# Patient Record
Sex: Female | Born: 1995 | Race: White | Hispanic: No | Marital: Single | Smoking: Never smoker
Health system: Southern US, Community
[De-identification: ages and names within clinical notes are randomized; demographics above are authoritative.]

---
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
## 2017-08-06 ENCOUNTER — Other Ambulatory Visit: Payer: Self-pay

## 2017-08-06 ENCOUNTER — Encounter: Payer: Self-pay | Admitting: Emergency Medicine

## 2017-08-06 ENCOUNTER — Emergency Department (INDEPENDENT_AMBULATORY_CARE_PROVIDER_SITE_OTHER)
Admission: EM | Admit: 2017-08-06 | Discharge: 2017-08-06 | Disposition: A | Discharge status: Home / Self Care | Payer: BLUE CROSS/BLUE SHIELD | Source: Home / Self Care | Attending: Emergency Medicine | Admitting: Emergency Medicine

## 2017-08-06 DIAGNOSIS — R0981 Nasal congestion: Secondary | ICD-10-CM | POA: Diagnosis not present

## 2017-08-06 DIAGNOSIS — R040 Epistaxis: Secondary | ICD-10-CM

## 2017-08-06 DIAGNOSIS — R112 Nausea with vomiting, unspecified: Secondary | ICD-10-CM

## 2017-08-06 LAB — POCT INFLUENZA A/B
Influenza A, POC: NEGATIVE
Influenza B, POC: NEGATIVE

## 2017-08-06 LAB — POCT RAPID STREP A (OFFICE): Rapid Strep A Screen: NEGATIVE

## 2017-08-06 MED ORDER — ONDANSETRON 8 MG PO TBDP: 8.0000 mg | ORAL_TABLET | Freq: Three times a day (TID) | ORAL | 0 refills | Status: DC | PRN

## 2017-08-06 NOTE — Discharge Instructions (Addendum)
Please drink good quantities of fluids. Apply Vaseline to the inside of your left nares. Take Zyrtec 1 a day. Take Zofran every 6 hours as needed for nausea.

## 2017-08-06 NOTE — ED Triage Notes (Signed)
Patient reports vomiting, congestion, headaches since yesterday; no known fever; states has had epistaxis and some chest discomfort.

## 2017-08-06 NOTE — ED Provider Notes (Addendum)
[facility]    CSN: [id number] Arrival date & time: 08/06/17  1350     History   Chief Complaint Chief Complaint  Patient presents with  . Emesis  . Headache  . Nasal Congestion    HPI Paula Buchanan is a 22 y.o. female.   HPI Patient was in her usual state of health until yesterday.  She developed nasal congestion.  Last night she had hot flashes and sweats associated with myalgias.  She did not have a flu shot last year she has had myalgias associated with this.  This morning she had a headache associated with sinus congestion and noticed bright red blood from her left nares.  She has had 2 episodes of nausea with vomiting.  There were some flecks of bright red blood in her vomitus.  She has had no abdominal pain and no heartburn.  She has a minimal cough.  She works and goes to school.  Her only medications being her birth control pills. History reviewed. No pertinent past medical history.  Patient Active Problem List   Diagnosis Date Noted  . Strain of right triceps 02/15/2015  . 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 No pertinent surgical history.  OB History   None      Home Medications    Prior to Admission medications   Medication Sig Start Date End Date Taking? Authorizing Provider  ondansetron (ZOFRAN-ODT) 8 MG disintegrating tablet Take 1 tablet (8 mg total) by mouth every 8 (eight) hours as needed for nausea. 08/06/17   Collene Gobbleaub, Dunia Pringle A, MD    Family History Family History  Problem Relation Age of Onset  . Cancer Other        colon CA    Social History Social History   Tobacco Use  . Smoking status: Never Smoker  . Smokeless tobacco: Never Used  Substance Use Topics  . Alcohol use: No  . Drug use: No     Allergies   Patient has no known allergies.   Review of Systems Review of Systems  Constitutional: Positive for chills, fatigue and fever.  HENT:  Positive for congestion, nosebleeds and postnasal drip. Negative for sore throat.   Respiratory: Negative.  Negative for cough and shortness of breath.   Cardiovascular: Positive for chest pain.  Gastrointestinal: Positive for nausea and vomiting.     Physical Exam Triage Vital Signs ED Triage Vitals  Enc Vitals Group     BP 08/06/17 1412 112/72     Pulse Rate 08/06/17 1412 84     Resp 08/06/17 1412 16     Temp 08/06/17 1412 98.6 F (37 C)     Temp Source 08/06/17 1412 Oral     SpO2 08/06/17 1412 97 %     Weight 08/06/17 1413 138 lb (62.6 kg)     Height 08/06/17 1413 5\' 4"  (1.626 m)     Head Circumference --      Peak Flow --      Pain Score 08/06/17 1413 0     Pain Loc --      Pain Edu? --      Excl. in GC? --    No data found.  Updated Vital Signs BP 112/72 (BP Location: Left Arm)   Pulse 84   Temp 98.6 F (37 C) (Oral)   Resp 16   Ht 5\' 4"  (1.626 m)   Wt 138 lb (62.6 kg)  LMP 07/18/2017 (Exact Date)   SpO2 97%   BMI 23.69 kg/m   Visual Acuity Right Eye Distance:   Left Eye Distance:   Bilateral Distance:    Right Eye Near:   Left Eye Near:    Bilateral Near:     Physical Exam  Constitutional: She appears well-developed and well-nourished.  Non-toxic appearance. No distress.  HENT:  Head: Normocephalic and atraumatic.  Mouth/Throat: Oropharynx is clear and moist.  Eyes: Pupils are equal, round, and reactive to light. EOM are normal.  Neck: Normal range of motion. Neck supple.  Cardiovascular: Normal rate, regular rhythm and normal heart sounds.  Pulmonary/Chest: Effort normal and breath sounds normal.  Abdominal: Soft. She exhibits no distension and no mass. There is no tenderness. There is no guarding.  Neurological: She is alert.  Skin: Skin is warm and dry.  Psychiatric: She has a normal mood and affect. Her behavior is normal.     UC Treatments / Results  Labs (all labs ordered are listed, but only abnormal results are displayed) Labs  Reviewed  POCT RAPID STREP A (OFFICE)  POCT INFLUENZA A/B    EKG None Radiology No results found.  Procedures Procedures (including critical care time)  Medications Ordered in UC Medications - No data to display   Initial Impression / Assessment and Plan / UC Course  I have reviewed the triage vital signs and the nursing notes.  Pertinent labs & imaging results that were available during my care of the patient were reviewed by me and considered in my medical decision making (see chart for details). Patient presents with a viral type illness.  She has had some nausea with this.  She had a left-sided nosebleed and I suspect the vomitus had some blood from the nosebleed.  She did not have a flu shot last year.  She was given Zofran on arrival to help with nausea.  Flu test and strep tests are negative.  She will take Aleve for her headache.  I advised her to take Claritin or Zyrtec.  She will apply Vaseline to the inside of her nose.  She was given a note for work and school.  Prescription sent to the pharmacy for Zofran for nausea.   Please drink good quantities of fluids. Apply Vaseline to the inside of your left nares. Take Zyrtec 1 a day. Take Zofran every 6 hours as needed for nausea.   Final Clinical Impressions(s) / UC Diagnoses   Final diagnoses:  Nasal congestion  Epistaxis  Non-intractable vomiting with nausea, unspecified vomiting type    ED Discharge Orders        Ordered    ondansetron (ZOFRAN-ODT) 8 MG disintegrating tablet  Every 8 hours PRN     08/06/17 1455       Controlled Substance Prescriptions [place] Controlled Substance Registry consulted? Not Applicable   Collene Gobble, MD 08/06/17 1506    Collene Gobble, MD 08/06/17 [id number]

## 2018-02-12 ENCOUNTER — Emergency Department (INDEPENDENT_AMBULATORY_CARE_PROVIDER_SITE_OTHER)
Admission: EM | Admit: 2018-02-12 | Discharge: 2018-02-12 | Disposition: A | Discharge status: Home / Self Care | Payer: BLUE CROSS/BLUE SHIELD | Source: Home / Self Care | Attending: Family Medicine | Admitting: Family Medicine

## 2018-02-12 ENCOUNTER — Other Ambulatory Visit: Payer: Self-pay

## 2018-02-12 ENCOUNTER — Encounter: Payer: Self-pay | Admitting: *Deleted

## 2018-02-12 DIAGNOSIS — H00014 Hordeolum externum left upper eyelid: Secondary | ICD-10-CM

## 2018-02-12 DIAGNOSIS — N920 Excessive and frequent menstruation with regular cycle: Secondary | ICD-10-CM | POA: Insufficient documentation

## 2018-02-12 MED ORDER — CLINDAMYCIN HCL 300 MG PO CAPS: 300.0000 mg | ORAL_CAPSULE | Freq: Three times a day (TID) | ORAL | 0 refills | Status: DC

## 2018-02-12 MED ORDER — POLYMYXIN B-TRIMETHOPRIM 10000-0.1 UNIT/ML-% OP SOLN: 1.0000 [drp] | OPHTHALMIC | 0 refills | Status: AC

## 2018-02-12 NOTE — ED Triage Notes (Signed)
Pt c/o LT eye lid swelling and pain x 5 days after plucking her eye brows. She has applied ABT oint without relief.

## 2018-02-12 NOTE — Discharge Instructions (Signed)
°  Please take antibiotics as prescribed and be sure to complete entire course even if you start to feel better to ensure infection does not come back.  Please follow up with family medicine or an eye specialist in 3-4 days if not improving.

## 2018-02-12 NOTE — ED Provider Notes (Signed)
[facility]    CSN: [id number] Arrival date & time: 02/12/18  1307     History   Chief Complaint Chief Complaint  Patient presents with  . Eye Problem    HPI Paula Buchanan is a 22 y.o. female.   HPI  Paula Buchanan is a 21 y.o. female presenting to UC with c/o Left upper eyelid pain, swelling, redness, and intermittent drainage for 5 days after plucking her eyebrows.  She has applied an antibiotic ointment her mother had the last 5 days w/o relief. She does not know the name of the medication.  She tried to "pop" the area but no relief.  Denies change in vision.    History reviewed. No pertinent past medical history.  Patient Active Problem List   Diagnosis Date Noted  . Menorrhagia 02/12/2018  . Strain of right triceps 02/15/2015  . 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 No pertinent surgical history.  OB History   None      Home Medications    Prior to Admission medications   Medication Sig Start Date End Date Taking? Authorizing Provider  drospirenone-ethinyl estradiol (NIKKI) 3-0.02 MG tablet Take 1 tablet by mouth daily.   Yes [provider]  clindamycin (CLEOCIN) 300 MG capsule Take 1 capsule (300 mg total) by mouth 3 (three) times daily. 02/12/18   Lurene Shadow, PA-C  trimethoprim-polymyxin b (POLYTRIM) ophthalmic solution Place 1 drop into the left eye every 4 (four) hours for 7 days. 02/12/18 02/19/18  Lurene Shadow, PA-C    Family History Family History  Problem Relation Age of Onset  . Cancer Other        colon CA    Social History Social History   Tobacco Use  . Smoking status: Never Smoker  . Smokeless tobacco: Never Used  Substance Use Topics  . Alcohol use: No  . Drug use: No     Allergies   Patient has no known allergies.   Review of Systems Review of Systems  Constitutional: Negative for chills and fever.  Eyes: Negative  for photophobia.  Neurological: Negative for headaches.     Physical Exam Triage Vital Signs ED Triage Vitals  Enc Vitals Group     BP 02/12/18 1324 123/77     Pulse Rate 02/12/18 1324 (!) 115     Resp 02/12/18 1324 16     Temp 02/12/18 1324 98.5 F (36.9 C)     Temp Source 02/12/18 1324 Oral     SpO2 02/12/18 1324 100 %     Weight 02/12/18 1325 142 lb (64.4 kg)     Height 02/12/18 1325 5\' 4"  (1.626 m)     Head Circumference --      Peak Flow --      Pain Score 02/12/18 1324 8     Pain Loc --      Pain Edu? --      Excl. in GC? --    No data found.  Updated Vital Signs BP 123/77 (BP Location: Right Arm)   Pulse (!) 103   Temp 98.5 F (36.9 C) (Oral)   Resp 16   Ht 5\' 4"  (1.626 m)   Wt 142 lb (64.4 kg)   LMP 01/27/2018   SpO2 100%   BMI 24.37 kg/m   Visual Acuity Right Eye Distance:   Left Eye Distance:   Bilateral Distance:    Right Eye  Near:   Left Eye Near:    Bilateral Near:     Physical Exam  Constitutional: She is oriented to person, place, and time. She appears well-developed and well-nourished.  HENT:  Head: Normocephalic and atraumatic.  Eyes: EOM are normal. Left eye exhibits hordeolum.    Left upper eyelid, lateral aspect: 1cm area erythema, edema, tenderness. No active drainage.   Neck: Normal range of motion.  Cardiovascular: Normal rate.  Pulmonary/Chest: Effort normal.  Musculoskeletal: Normal range of motion.  Neurological: She is alert and oriented to person, place, and time.  Skin: Skin is warm and dry.  Psychiatric: She has a normal mood and affect. Her behavior is normal.  Nursing note and vitals reviewed.    UC Treatments / Results  Labs (all labs ordered are listed, but only abnormal results are displayed) Labs Reviewed - No data to display  EKG None  Radiology No results found.  Procedures Procedures (including critical care time)  Medications Ordered in UC Medications - No data to display  Initial Impression /  Assessment and Plan / UC Course  I have reviewed the triage vital signs and the nursing notes.  Pertinent labs & imaging results that were available during my care of the patient were reviewed by me and considered in my medical decision making (see chart for details).    Hordeolum Left upper eyelid. Will tx with oral and topical antibiotics.   Final Clinical Impressions(s) / UC Diagnoses   Final diagnoses:  Hordeolum externum left upper eyelid     Discharge Instructions      Please take antibiotics as prescribed and be sure to complete entire course even if you start to feel better to ensure infection does not come back.  Please follow up with family medicine or an eye specialist in 3-4 days if not improving.    ED Prescriptions    Medication Sig Dispense Auth. Provider   clindamycin (CLEOCIN) 300 MG capsule Take 1 capsule (300 mg total) by mouth 3 (three) times daily. 21 capsule Doroteo Glassman, Avrie Kedzierski O, PA-C   trimethoprim-polymyxin b (POLYTRIM) ophthalmic solution Place 1 drop into the left eye every 4 (four) hours for 7 days. 10 mL Lurene Shadow, PA-C     Controlled Substance Prescriptions [place] Controlled Substance Registry consulted? Not Applicable   Rolla Plate 02/12/18 1722

## 2018-07-21 ENCOUNTER — Encounter: Payer: Self-pay | Admitting: *Deleted

## 2018-07-21 ENCOUNTER — Other Ambulatory Visit: Payer: Self-pay

## 2018-07-21 ENCOUNTER — Emergency Department (INDEPENDENT_AMBULATORY_CARE_PROVIDER_SITE_OTHER)
Admission: EM | Admit: 2018-07-21 | Discharge: 2018-07-21 | Disposition: A | Discharge status: Home / Self Care | Payer: BLUE CROSS/BLUE SHIELD | Source: Home / Self Care

## 2018-07-21 DIAGNOSIS — J029 Acute pharyngitis, unspecified: Secondary | ICD-10-CM | POA: Diagnosis not present

## 2018-07-21 LAB — POCT RAPID STREP A (OFFICE): Rapid Strep A Screen: NEGATIVE

## 2018-07-21 NOTE — ED Provider Notes (Signed)
[facility]    CSN: [id number] Arrival date & time: 07/21/18  1226     History   Chief Complaint Chief Complaint  Patient presents with  . Sore Throat  . Fever    HPI Paula Buchanan is a 23 y.o. female.   HPI  Paula Buchanan is a 23 y.o. female presenting to UC with c/o sore throat with fever Tmax 100.8*F for 2 days. Mild nasal congestion and frontal headache.  She has taken a liquid fever recommended by her mother with moderate relief. No medication taken today. She was around her neighbor 2 days ago, who has been sick but she has not been to the doctor. Denies cough, body aches, n/v/d.   History reviewed. No pertinent past medical history.  Patient Active Problem List   Diagnosis Date Noted  . Menorrhagia 02/12/2018  . Strain of right triceps 02/15/2015  . 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 No pertinent surgical history.  OB History   No obstetric history on file.      Home Medications    Prior to Admission medications   Medication Sig Start Date End Date Taking? Authorizing Provider  drospirenone-ethinyl estradiol (NIKKI) 3-0.02 MG tablet Take 1 tablet by mouth daily.    [provider]    Family History Family History  Problem Relation Age of Onset  . Cancer Other        colon CA    Social History Social History   Tobacco Use  . Smoking status: Never Smoker  . Smokeless tobacco: Never Used  Substance Use Topics  . Alcohol use: No  . Drug use: No     Allergies   Patient has no known allergies.   Review of Systems Review of Systems  Constitutional: Positive for fever. Negative for chills.  HENT: Positive for congestion and sore throat. Negative for ear pain, trouble swallowing and voice change.   Respiratory: Negative for cough and shortness of breath.   Cardiovascular: Negative for chest pain and palpitations.  Gastrointestinal:  Negative for abdominal pain, diarrhea, nausea and vomiting.  Musculoskeletal: Negative for arthralgias, back pain and myalgias.  Skin: Negative for rash.  Neurological: Positive for headaches. Negative for dizziness and light-headedness.     Physical Exam Triage Vital Signs ED Triage Vitals  Enc Vitals Group     BP 07/21/18 1243 122/75     Pulse Rate 07/21/18 1243 97     Resp 07/21/18 1243 16     Temp 07/21/18 1243 98.3 F (36.8 C)     Temp Source 07/21/18 1243 Oral     SpO2 07/21/18 1243 98 %     Weight 07/21/18 1244 145 lb (65.8 kg)     Height 07/21/18 1244 5\' 4"  (1.626 m)     Head Circumference --      Peak Flow --      Pain Score 07/21/18 1244 0     Pain Loc --      Pain Edu? --      Excl. in GC? --    No data found.  Updated Vital Signs BP 122/75 (BP Location: Right Arm)   Pulse 97   Temp 98.3 F (36.8 C) (Oral)   Resp 16   Ht 5\' 4"  (1.626 m)   Wt 145 lb (65.8 kg)   LMP 07/15/2018   SpO2 98%   BMI 24.89 kg/m   Visual Acuity Right Eye  Distance:   Left Eye Distance:   Bilateral Distance:    Right Eye Near:   Left Eye Near:    Bilateral Near:     Physical Exam Vitals signs and nursing note reviewed.  Constitutional:      Appearance: She is well-developed.  HENT:     Head: Normocephalic and atraumatic.     Right Ear: Tympanic membrane normal.     Left Ear: Tympanic membrane normal.     Nose: Nose normal.     Right Sinus: No maxillary sinus tenderness or frontal sinus tenderness.     Left Sinus: No maxillary sinus tenderness or frontal sinus tenderness.     Mouth/Throat:     Lips: Pink.     Mouth: Mucous membranes are moist.     Pharynx: Oropharynx is clear. Uvula midline. Posterior oropharyngeal erythema present. No pharyngeal swelling, oropharyngeal exudate or uvula swelling.  Neck:     Musculoskeletal: Normal range of motion and neck supple.  Cardiovascular:     Rate and Rhythm: Normal rate and regular rhythm.  Pulmonary:     Effort: Pulmonary  effort is normal. No respiratory distress.     Breath sounds: Normal breath sounds. No stridor. No wheezing or rhonchi.  Musculoskeletal: Normal range of motion.  Lymphadenopathy:     Cervical: No cervical adenopathy.  Skin:    General: Skin is warm and dry.  Neurological:     Mental Status: She is alert and oriented to person, place, and time.  Psychiatric:        Behavior: Behavior normal.      UC Treatments / Results  Labs (all labs ordered are listed, but only abnormal results are displayed) Labs Reviewed  STREP A DNA PROBE  POCT RAPID STREP A (OFFICE)    EKG None  Radiology No results found.  Procedures Procedures (including critical care time)  Medications Ordered in UC Medications - No data to display  Initial Impression / Assessment and Plan / UC Course  I have reviewed the triage vital signs and the nursing notes.  Pertinent labs & imaging results that were available during my care of the patient were reviewed by me and considered in my medical decision making (see chart for details).     Rapid strep: NEGATIVE Culture sent encouraged symptomatic tx  Final Clinical Impressions(s) / UC Diagnoses   Final diagnoses:  Acute pharyngitis, unspecified etiology     Discharge Instructions      You may take 500mg  acetaminophen every 4-6 hours or in combination with ibuprofen 400-600mg  every 6-8 hours as needed for pain, inflammation, and fever.  Be sure to well hydrated with clear liquids and get at least 8 hours of sleep at night, preferably more while sick.   Please follow up with family medicine in 1 week if needed.     ED Prescriptions    None     Controlled Substance Prescriptions [place] Controlled Substance Registry consulted? Not Applicable   Rolla Plate 07/21/18 1312

## 2018-07-21 NOTE — Discharge Instructions (Signed)
  You may take 500mg acetaminophen every 4-6 hours or in combination with ibuprofen 400-600mg every 6-8 hours as needed for pain, inflammation, and fever.  Be sure to well hydrated with clear liquids and get at least 8 hours of sleep at night, preferably more while sick.   Please follow up with family medicine in 1 week if needed.   

## 2018-07-21 NOTE — ED Triage Notes (Signed)
Pt c/o sore throat and fever x2 days.  

## 2018-07-22 ENCOUNTER — Telehealth: Payer: Self-pay

## 2018-07-22 LAB — STREP A DNA PROBE: Group A Strep Probe: NOT DETECTED

## 2018-07-22 NOTE — Telephone Encounter (Signed)
Pt notified of neg tcx results. Call if any questions.

## 2019-06-16 DIAGNOSIS — Z113 Encounter for screening for infections with a predominantly sexual mode of transmission: Secondary | ICD-10-CM | POA: Diagnosis not present

## 2019-06-16 DIAGNOSIS — Z01419 Encounter for gynecological examination (general) (routine) without abnormal findings: Secondary | ICD-10-CM | POA: Diagnosis not present

## 2019-06-16 DIAGNOSIS — Z6827 Body mass index (BMI) 27.0-27.9, adult: Secondary | ICD-10-CM | POA: Diagnosis not present

## 2019-10-25 ENCOUNTER — Other Ambulatory Visit: Payer: Self-pay

## 2019-10-25 ENCOUNTER — Encounter: Payer: Self-pay | Admitting: Sports Medicine

## 2019-10-25 ENCOUNTER — Ambulatory Visit (INDEPENDENT_AMBULATORY_CARE_PROVIDER_SITE_OTHER): Payer: BC Managed Care – PPO | Admitting: Sports Medicine

## 2019-10-25 DIAGNOSIS — M7671 Peroneal tendinitis, right leg: Secondary | ICD-10-CM | POA: Diagnosis not present

## 2019-10-25 MED ORDER — MELOXICAM 15 MG PO TABS: ORAL_TABLET | ORAL | 3 refills | Status: AC

## 2019-10-25 NOTE — Assessment & Plan Note (Signed)
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 She has developed pain in her right ankle, just distal to the tip of the lateral malleolus, directly over the peroneals, reproduction of pain with resisted eversion all consistent with a peroneal tendinitis. We will start conservatively, meloxicam, ASO, lateral heel wedge, home rehab exercises, out of work for the rest of this week. Return to see me in 3 weeks, injection if no better.

## 2019-10-25 NOTE — Progress Notes (Signed)
    Procedures performed today:    None.  Independent interpretation of notes and tests performed by another provider:   None.  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 She has developed pain in her right ankle, just distal to the tip of the lateral malleolus, directly over the peroneals, reproduction of pain with resisted eversion all consistent with a peroneal tendinitis. We will start conservatively, meloxicam, ASO, lateral heel wedge, home rehab exercises, out of work for the rest of this week. Return to see me in 3 weeks, injection if no better.    ___________________________________________ Ihor Austin. Benjamin Stain, M.D., ABFM., CAQSM. Primary Care and Sports Medicine [facility]  Adjunct Instructor of Family Medicine  [employer]

## 2019-11-16 ENCOUNTER — Ambulatory Visit: Payer: BC Managed Care – PPO | Admitting: Sports Medicine

## 2020-01-12 ENCOUNTER — Ambulatory Visit: Payer: Self-pay

## 2020-01-13 DIAGNOSIS — J029 Acute pharyngitis, unspecified: Secondary | ICD-10-CM | POA: Diagnosis not present

## 2020-01-13 DIAGNOSIS — R0981 Nasal congestion: Secondary | ICD-10-CM | POA: Diagnosis not present

## 2020-09-25 DIAGNOSIS — Z113 Encounter for screening for infections with a predominantly sexual mode of transmission: Secondary | ICD-10-CM | POA: Diagnosis not present

## 2020-09-25 DIAGNOSIS — Z01419 Encounter for gynecological examination (general) (routine) without abnormal findings: Secondary | ICD-10-CM | POA: Diagnosis not present

## 2020-09-25 DIAGNOSIS — Z6828 Body mass index (BMI) 28.0-28.9, adult: Secondary | ICD-10-CM | POA: Diagnosis not present
